# Patient Record
Sex: Male | Born: 1985 | Hispanic: No | Marital: Single | Smoking: Current every day smoker
Health system: Southern US, Community
[De-identification: ages and names within clinical notes are randomized; demographics above are authoritative.]

## PROBLEM LIST (undated history)

## (undated) HISTORY — PX: TONSILLECTOMY: SUR1361

## (undated) HISTORY — PX: HERNIA REPAIR: SHX51

---
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
## 2009-07-18 ENCOUNTER — Emergency Department (HOSPITAL_COMMUNITY): Admission: EM | Admit: 2009-07-18 | Discharge: 2009-07-18 | Payer: Self-pay | Admitting: Family Medicine
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
## 2017-09-23 ENCOUNTER — Other Ambulatory Visit: Payer: Self-pay

## 2017-09-23 ENCOUNTER — Encounter (HOSPITAL_BASED_OUTPATIENT_CLINIC_OR_DEPARTMENT_OTHER): Payer: Self-pay | Admitting: *Deleted

## 2017-09-23 ENCOUNTER — Emergency Department (HOSPITAL_BASED_OUTPATIENT_CLINIC_OR_DEPARTMENT_OTHER)
Admission: EM | Admit: 2017-09-23 | Discharge: 2017-09-23 | Disposition: A | Discharge status: Home / Self Care | Payer: Self-pay | Attending: Emergency Medicine | Admitting: Emergency Medicine

## 2017-09-23 DIAGNOSIS — F17210 Nicotine dependence, cigarettes, uncomplicated: Secondary | ICD-10-CM | POA: Insufficient documentation

## 2017-09-23 DIAGNOSIS — K0889 Other specified disorders of teeth and supporting structures: Secondary | ICD-10-CM | POA: Insufficient documentation

## 2017-09-23 MED ORDER — PENICILLIN V POTASSIUM 500 MG PO TABS: 500.0000 mg | ORAL_TABLET | Freq: Three times a day (TID) | ORAL | 0 refills | Status: DC

## 2017-09-23 MED ORDER — HYDROCODONE-ACETAMINOPHEN 5-325 MG PO TABS
1.0000 | ORAL_TABLET | Freq: Once | ORAL | Status: AC
  Administered 2017-09-23: 1 via ORAL
  Filled 2017-09-23: qty 1

## 2017-09-23 MED ORDER — IBUPROFEN 600 MG PO TABS: 600.0000 mg | ORAL_TABLET | Freq: Three times a day (TID) | ORAL | 0 refills | Status: DC | PRN

## 2017-09-23 MED ORDER — PENICILLIN V POTASSIUM 250 MG PO TABS
500.0000 mg | ORAL_TABLET | Freq: Once | ORAL | Status: AC
  Administered 2017-09-23: 500 mg via ORAL
  Filled 2017-09-23: qty 2

## 2017-09-23 MED ORDER — IBUPROFEN 400 MG PO TABS
600.0000 mg | ORAL_TABLET | Freq: Once | ORAL | Status: AC
  Administered 2017-09-23: 600 mg via ORAL
  Filled 2017-09-23: qty 1

## 2017-09-23 NOTE — ED Provider Notes (Signed)
[facility] EMERGENCY DEPARTMENT Provider Note   CSN: [id number] Arrival date & time: 09/23/17  0057     History   Chief Complaint Chief Complaint  Patient presents with  . Dental Pain    HPI Nicholas CockerDerek Pursley Jr. is a 32 y.o. male.  HPI 32 year old male presents the emergency department with intermittent left-sided jaw pain over the past year with worsening left jaw and left ear pain today.  No improvement with anti-inflammatories.  Patient reports no significant pain with chewing.  No fevers or chills.  No significant headache.  Pain is moderate in severity.  History of mandibular ORIF 2 years ago  History reviewed. No pertinent past medical history.  There are no active problems to display for this patient.   History reviewed. No pertinent surgical history.      Home Medications    Prior to Admission medications   Medication Sig Start Date End Date Taking? Authorizing Provider  ibuprofen (ADVIL,MOTRIN) 600 MG tablet Take 1 tablet (600 mg total) by mouth every 8 (eight) hours as needed. 09/23/17   Azalia Bilisampos, Elorah Dewing, MD  penicillin v potassium (VEETID) 500 MG tablet Take 1 tablet (500 mg total) by mouth 3 (three) times daily. 09/23/17   Azalia Bilisampos, Evett Kassa, MD    Family History History reviewed. No pertinent family history.  Social History Social History   Tobacco Use  . Smoking status: Current Every Day Smoker    Packs/day: 0.50    Types: Cigarettes  . Smokeless tobacco: Never Used  Substance Use Topics  . Alcohol use: Not Currently  . Drug use: Not Currently     Allergies   Patient has no known allergies.   Review of Systems Review of Systems  All other systems reviewed and are negative.    Physical Exam Updated Vital Signs BP 130/86 (BP Location: Left Arm)   Pulse 63   Temp 98.7 F (37.1 C) (Oral)   Resp 18   Ht 5\' 6"  (1.676 m)   Wt 72.6 kg (160 lb)   SpO2 97%   BMI 25.82 kg/m   Physical Exam  Constitutional: He is oriented to person,  place, and time. He appears well-developed and well-nourished.  HENT:  Head: Normocephalic.  Impacted left lower third molar with tenderness.  No gingival swelling or fluctuance.  Posterior pharynx is normal.  Uvula is midline.  No intraoral swelling.  Bilateral TMs are normal.  No mastoid tenderness  Eyes: EOM are normal.  Neck: Normal range of motion.  No neck swelling  Pulmonary/Chest: Effort normal.  Abdominal: He exhibits no distension.  Musculoskeletal: Normal range of motion.  Neurological: He is alert and oriented to person, place, and time.  Psychiatric: He has a normal mood and affect.  Nursing note and vitals reviewed.    ED Treatments / Results  Labs (all labs ordered are listed, but only abnormal results are displayed) Labs Reviewed - No data to display  EKG None  Radiology No results found.  Procedures Procedures (including critical care time)  Medications Ordered in ED Medications  ibuprofen (ADVIL,MOTRIN) tablet 600 mg (has no administration in time range)  HYDROcodone-acetaminophen (NORCO/VICODIN) 5-325 MG per tablet 1 tablet (has no administration in time range)  penicillin v potassium (VEETID) tablet 500 mg (has no administration in time range)     Initial Impression / Assessment and Plan / ED Course  I have reviewed the triage vital signs and the nursing notes.  Pertinent labs & imaging results that were available during my  care of the patient were reviewed by me and considered in my medical decision making (see chart for details).     Impacted molar.  Patient be placed on anti-inflammatories and antibiotics.  He will need oral surgery follow-up.  No indication for advanced imaging today.  He will need Panorex as an outpatient and further oral surgery/dental evaluation  Final Clinical Impressions(s) / ED Diagnoses   Final diagnoses:  Pain, dental    ED Discharge Orders        Ordered    penicillin v potassium (VEETID) 500 MG tablet  3 times  daily     09/23/17 0149    ibuprofen (ADVIL,MOTRIN) 600 MG tablet  Every 8 hours PRN     09/23/17 0149       Azalia Bilis, MD 09/23/17 [phone]

## 2017-09-23 NOTE — ED Triage Notes (Signed)
Left sided jaw pain with radiation up into ear intermittently x 1 year. This episode started tonight.

## 2017-10-03 ENCOUNTER — Other Ambulatory Visit: Payer: Self-pay

## 2017-10-03 ENCOUNTER — Emergency Department (HOSPITAL_BASED_OUTPATIENT_CLINIC_OR_DEPARTMENT_OTHER)
Admission: EM | Admit: 2017-10-03 | Discharge: 2017-10-03 | Disposition: A | Discharge status: Home / Self Care | Payer: Worker's Compensation | Attending: Emergency Medicine | Admitting: Emergency Medicine

## 2017-10-03 ENCOUNTER — Emergency Department (HOSPITAL_BASED_OUTPATIENT_CLINIC_OR_DEPARTMENT_OTHER): Payer: Worker's Compensation

## 2017-10-03 ENCOUNTER — Encounter (HOSPITAL_BASED_OUTPATIENT_CLINIC_OR_DEPARTMENT_OTHER): Payer: Self-pay | Admitting: *Deleted

## 2017-10-03 DIAGNOSIS — Y990 Civilian activity done for income or pay: Secondary | ICD-10-CM | POA: Diagnosis not present

## 2017-10-03 DIAGNOSIS — S61011A Laceration without foreign body of right thumb without damage to nail, initial encounter: Secondary | ICD-10-CM | POA: Diagnosis not present

## 2017-10-03 DIAGNOSIS — Y92511 Restaurant or cafe as the place of occurrence of the external cause: Secondary | ICD-10-CM | POA: Insufficient documentation

## 2017-10-03 DIAGNOSIS — S61012A Laceration without foreign body of left thumb without damage to nail, initial encounter: Secondary | ICD-10-CM

## 2017-10-03 DIAGNOSIS — Y9389 Activity, other specified: Secondary | ICD-10-CM | POA: Diagnosis not present

## 2017-10-03 DIAGNOSIS — W260XXA Contact with knife, initial encounter: Secondary | ICD-10-CM | POA: Insufficient documentation

## 2017-10-03 MED ORDER — LIDOCAINE HCL (PF) 1 % IJ SOLN
5.0000 mL | Freq: Once | INTRAMUSCULAR | Status: AC
  Administered 2017-10-03: 5 mL
  Filled 2017-10-03: qty 5

## 2017-10-03 MED ORDER — LIDOCAINE HCL (PF) 1 % IJ SOLN
INTRAMUSCULAR | Status: AC
  Filled 2017-10-03: qty 5

## 2017-10-03 MED ORDER — ACETAMINOPHEN 500 MG PO TABS: 500.0000 mg | ORAL_TABLET | Freq: Four times a day (QID) | ORAL | 0 refills | Status: AC | PRN

## 2017-10-03 MED ORDER — LIDOCAINE HCL (PF) 1 % IJ SOLN
5.0000 mL | Freq: Once | INTRAMUSCULAR | Status: AC
  Administered 2017-10-03: 5 mL

## 2017-10-03 MED ORDER — IBUPROFEN 800 MG PO TABS: 800.0000 mg | ORAL_TABLET | Freq: Three times a day (TID) | ORAL | 0 refills | Status: AC

## 2017-10-03 NOTE — Discharge Instructions (Signed)
Treatment: Keep your wound dry and dressing applied until this time tomorrow. After 24 hours, you may wash with warm soapy water. Dry and apply antibiotic ointment and clean dressing. Do this daily until your sutures are removed.  Follow-up: Please return to emergency department in 10 days for suture removal. Be aware of signs of infection: fever, increasing pain, redness, swelling, drainage from the area. Please call your primary care provider or return to emergency department if you develop any of these symptoms or if any of the sutures come out prior to removal. Please return to the emergency department if you develop any other new or worsening symptoms.

## 2017-10-03 NOTE — ED Provider Notes (Signed)
[facility] EMERGENCY DEPARTMENT Provider Note   CSN: [id number] Arrival date & time: 10/03/17  1659     History   Chief Complaint Chief Complaint  Patient presents with  . Laceration    HPI Nicholas CockerDerek Rumsey Jr. is a 32 y.o. male who presents with laceration to the right thumb after he was cutting a sandwich at his job at [employer].  He feels like he hit the bone.  He does have full range of motion and sensation intact.  His tetanus is up-to-date.  He denies any other injuries.  HPI  History reviewed. No pertinent past medical history.  There are no active problems to display for this patient.   Past Surgical History:  Procedure Laterality Date  . HERNIA REPAIR    . TONSILLECTOMY          Home Medications    Prior to Admission medications   Medication Sig Start Date End Date Taking? Authorizing Provider  acetaminophen (TYLENOL) 500 MG tablet Take 1 tablet (500 mg total) by mouth every 6 (six) hours as needed. 10/03/17   [name], Waylan BogaAlexandra M, PA-C  ibuprofen (ADVIL,MOTRIN) 800 MG tablet Take 1 tablet (800 mg total) by mouth 3 (three) times daily. 10/03/17   Emi HolesLaw, Kaizley Aja M, PA-C    Family History History reviewed. No pertinent family history.  Social History Social History   Tobacco Use  . Smoking status: Current Every Day Smoker    Packs/day: 0.50    Types: Cigarettes  . Smokeless tobacco: Never Used  Substance Use Topics  . Alcohol use: Not Currently  . Drug use: Not Currently     Allergies   Patient has no known allergies.   Review of Systems Review of Systems  Constitutional: Negative for fever.  Skin: Positive for wound.  Neurological: Negative for numbness.     Physical Exam Updated Vital Signs BP 138/74   Pulse 68   Temp 98.3 F (36.8 C) (Oral)   Resp 18   Ht 5\' 6"  (1.676 m)   Wt 72.6 kg (160 lb)   SpO2 98%   BMI 25.82 kg/m   Physical Exam  Constitutional: He appears well-developed and well-nourished. No distress.  HENT:    Head: Normocephalic and atraumatic.  Mouth/Throat: Oropharynx is clear and moist. No oropharyngeal exudate.  Eyes: Pupils are equal, round, and reactive to light. Conjunctivae are normal. Right eye exhibits no discharge. Left eye exhibits no discharge. No scleral icterus.  Neck: Normal range of motion.  Cardiovascular: Normal rate, regular rhythm, normal heart sounds and intact distal pulses. Exam reveals no gallop and no friction rub.  No murmur heard. Pulmonary/Chest: Effort normal and breath sounds normal. No stridor. No respiratory distress. He has no wheezes. He has no rales.  Musculoskeletal: He exhibits no edema.  2 cm laceration over the ulnar aspect of the IP joint on the L thumb; bleeding controlled, no tendon involvement visualized, FROM of the digit, sensation intact, cap refill <2secs  Neurological: He is alert. Coordination normal.  Skin: Skin is warm and dry. No rash noted. He is not diaphoretic. No pallor.  Psychiatric: He has a normal mood and affect.  Nursing note and vitals reviewed.    ED Treatments / Results  Labs (all labs ordered are listed, but only abnormal results are displayed) Labs Reviewed - No data to display  EKG None  Radiology Dg Finger Thumb Left  Result Date: 10/03/2017 CLINICAL DATA:  Cut finger with knife EXAM: LEFT THUMB 2+V COMPARISON:  None. FINDINGS: There is no evidence of fracture or dislocation. There is no evidence of arthropathy or other focal bone abnormality. No radiopaque foreign body in the soft tissues. IMPRESSION: No acute osseous abnormality. Electronically Signed   By: Jasmine Pang M.D.   On: 10/03/2017 18:02    Procedures .Marland KitchenLaceration Repair Date/Time: 10/03/2017 6:43 PM Performed by: Emi Holes, PA-C Authorized by: Emi Holes, PA-C   Consent:    Consent obtained:  Verbal   Consent given by:  Patient   Risks discussed:  Infection and pain   Alternatives discussed:  No treatment Anesthesia (see MAR for exact  dosages):    Anesthesia method:  Local infiltration   Local anesthetic:  Lidocaine 1% w/o epi Laceration details:    Location:  Finger   Finger location:  L thumb   Length (cm):  2   Depth (mm):  2 Repair type:    Repair type:  Simple Pre-procedure details:    Preparation:  Patient was prepped and draped in usual sterile fashion and imaging obtained to evaluate for foreign bodies Exploration:    Hemostasis achieved with:  Direct pressure and tourniquet   Wound exploration: wound explored through full range of motion and entire depth of wound probed and visualized     Wound extent: no foreign bodies/material noted, no nerve damage noted, no tendon damage noted and no underlying fracture noted     Contaminated: no   Treatment:    Area cleansed with:  Saline   Amount of cleaning:  Standard   Irrigation solution:  Sterile saline and tap water   Irrigation volume:  200   Irrigation method:  Syringe and pressure wash   Visualized foreign bodies/material removed: no   Skin repair:    Repair method:  Sutures   Suture size:  5-0   Wound skin closure material used: Ethilon.   Suture technique:  Simple interrupted   Number of sutures:  4 Approximation:    Approximation:  Close Post-procedure details:    Dressing:  Antibiotic ointment, sterile dressing and splint for protection   Patient tolerance of procedure:  Tolerated well, no immediate complications   (including critical care time)  Medications Ordered in ED Medications  lidocaine (PF) (XYLOCAINE) 1 % injection 5 mL (5 mLs Infiltration Given by Other 10/03/17 1715)  lidocaine (PF) (XYLOCAINE) 1 % injection 5 mL (5 mLs Infiltration Given 10/03/17 1819)     Initial Impression / Assessment and Plan / ED Course  I have reviewed the triage vital signs and the nursing notes.  Pertinent labs & imaging results that were available during my care of the patient were reviewed by me and considered in my medical decision making (see chart for  details).     Patient with left thumb laceration after cutting  it with a knife.  X-ray is negative for foreign body or osseous abnormality.  Tetanus UTD. Laceration occurred < 12 hours prior to repair. Discussed laceration care with pt and answered questions.  Splint given for protection to wear for 4-5 days.  Pt to f-u for suture removal in 10 days and wound check sooner should there be signs of dehiscence or infection. Pt is hemodynamically stable with no complaints prior to dc.  Patient understands and agrees with plan.  Patient vitals stable throughout ED course.   Final Clinical Impressions(s) / ED Diagnoses   Final diagnoses:  Laceration of left thumb without foreign body without damage to nail, initial encounter    ED  Discharge Orders        Ordered    ibuprofen (ADVIL,MOTRIN) 800 MG tablet  3 times daily     10/03/17 1842    acetaminophen (TYLENOL) 500 MG tablet  Every 6 hours PRN     10/03/17 1842       Emi Holes, PA-C 10/03/17 1846    Arby Barrette, MD 10/07/17 1231

## 2017-10-03 NOTE — ED Triage Notes (Signed)
Pt c/o lac to left thumb by metal x 1 hr ago

## 2017-10-03 NOTE — ED Notes (Signed)
Patient transported to X-ray 

## 2017-10-03 NOTE — ED Notes (Signed)
ED Provider at bedside. 

## 2017-10-03 NOTE — ED Notes (Signed)
Pt returned from xray

## 2017-10-03 NOTE — ED Notes (Signed)
Pt verbalizes understanding of d/c instructions and denies any further needs at this time. 

## 2017-10-03 NOTE — ED Notes (Signed)
EDP at bedside  

## 2017-10-15 ENCOUNTER — Encounter (HOSPITAL_BASED_OUTPATIENT_CLINIC_OR_DEPARTMENT_OTHER): Payer: Self-pay | Admitting: *Deleted

## 2017-10-15 ENCOUNTER — Other Ambulatory Visit: Payer: Self-pay

## 2017-10-15 ENCOUNTER — Emergency Department (HOSPITAL_BASED_OUTPATIENT_CLINIC_OR_DEPARTMENT_OTHER)
Admission: EM | Admit: 2017-10-15 | Discharge: 2017-10-15 | Disposition: A | Discharge status: Home / Self Care | Payer: Self-pay | Attending: Emergency Medicine | Admitting: Emergency Medicine

## 2017-10-15 DIAGNOSIS — F17210 Nicotine dependence, cigarettes, uncomplicated: Secondary | ICD-10-CM | POA: Insufficient documentation

## 2017-10-15 DIAGNOSIS — Z4802 Encounter for removal of sutures: Secondary | ICD-10-CM

## 2017-10-15 DIAGNOSIS — X58XXXA Exposure to other specified factors, initial encounter: Secondary | ICD-10-CM | POA: Insufficient documentation

## 2017-10-15 DIAGNOSIS — S61012D Laceration without foreign body of left thumb without damage to nail, subsequent encounter: Secondary | ICD-10-CM | POA: Insufficient documentation

## 2017-10-15 NOTE — ED Triage Notes (Signed)
Pt here for suture removal

## 2017-10-15 NOTE — ED Provider Notes (Signed)
[facility] EMERGENCY DEPARTMENT Provider Note   CSN: [id number] Arrival date & time: 10/15/17  1336     History   Chief Complaint Chief Complaint  Patient presents with  . Suture / Staple Removal    HPI Nicholas CockerDerek Kotch Jr. is a 32 y.o. male.  HPI Requesting suture removal from left thumb from 12 days ago.  No other complaints.  No fevers.  No drainage.  No redness.   History reviewed. No pertinent past medical history.  There are no active problems to display for this patient.   Past Surgical History:  Procedure Laterality Date  . HERNIA REPAIR    . TONSILLECTOMY          Home Medications    Prior to Admission medications   Medication Sig Start Date End Date Taking? Authorizing Provider  acetaminophen (TYLENOL) 500 MG tablet Take 1 tablet (500 mg total) by mouth every 6 (six) hours as needed. 10/03/17   Law, Waylan BogaAlexandra M, PA-C  ibuprofen (ADVIL,MOTRIN) 800 MG tablet Take 1 tablet (800 mg total) by mouth 3 (three) times daily. 10/03/17   Emi HolesLaw, Alexandra M, PA-C    Family History History reviewed. No pertinent family history.  Social History Social History   Tobacco Use  . Smoking status: Current Every Day Smoker    Packs/day: 0.50    Types: Cigarettes  . Smokeless tobacco: Never Used  Substance Use Topics  . Alcohol use: Not Currently  . Drug use: Not Currently     Allergies   Patient has no known allergies.   Review of Systems Review of Systems  All other systems reviewed and are negative.    Physical Exam Updated Vital Signs Ht 5\' 6"  (1.676 m)   Wt 72.6 kg (160 lb)   BMI 25.82 kg/m   Physical Exam  Constitutional: He is oriented to person, place, and time. He appears well-developed and well-nourished.  HENT:  Head: Normocephalic.  Eyes: EOM are normal.  Neck: Normal range of motion.  Pulmonary/Chest: Effort normal.  Abdominal: He exhibits no distension.  Musculoskeletal: Normal range of motion.  Left thumb with sutures in  place.  No wound dehiscence.  No signs of infection.  Neurological: He is alert and oriented to person, place, and time.  Psychiatric: He has a normal mood and affect.  Nursing note and vitals reviewed.    ED Treatments / Results  Labs (all labs ordered are listed, but only abnormal results are displayed) Labs Reviewed - No data to display  EKG None  Radiology No results found.  Procedures Procedures (including critical care time)  SUTURE REMOVAL Performed by: Azalia BilisKevin Danamarie Minami Consent: Verbal consent obtained. Patient identity confirmed: provided demographic data Time out: Immediately prior to procedure a "time out" was called to verify the correct patient, procedure, equipment, support staff and site/side marked as required. Location: left thum Wound Appearance: clean Sutures/Staples Removed: 4 Patient tolerance: Patient tolerated the procedure well with no immediate complications.     Medications Ordered in ED Medications - No data to display   Initial Impression / Assessment and Plan / ED Course  I have reviewed the triage vital signs and the nursing notes.  Pertinent labs & imaging results that were available during my care of the patient were reviewed by me and considered in my medical decision making (see chart for details).       Final Clinical Impressions(s) / ED Diagnoses   Final diagnoses:  Visit for suture removal    ED Discharge  Orders    None       Azalia Bilis, MD 10/15/17 1344

## 2017-10-15 NOTE — ED Notes (Signed)
ED Provider in triage to remove stitches

## 2018-11-07 IMAGING — CR DG FINGER THUMB 2+V*L*
3 series · 3 of 3 positions shown · non-contrast
Comparison: None.

CLINICAL DATA: Cut finger with knife

EXAM:
LEFT THUMB 2+V

[x finger pa left]
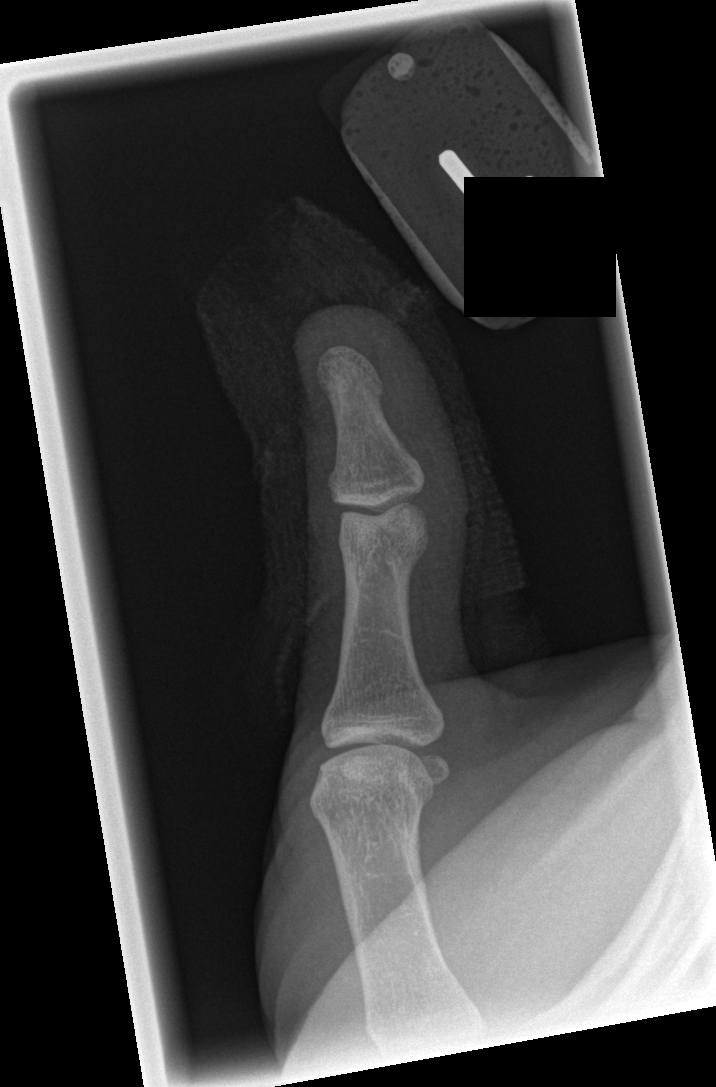

[x finger obl. left]
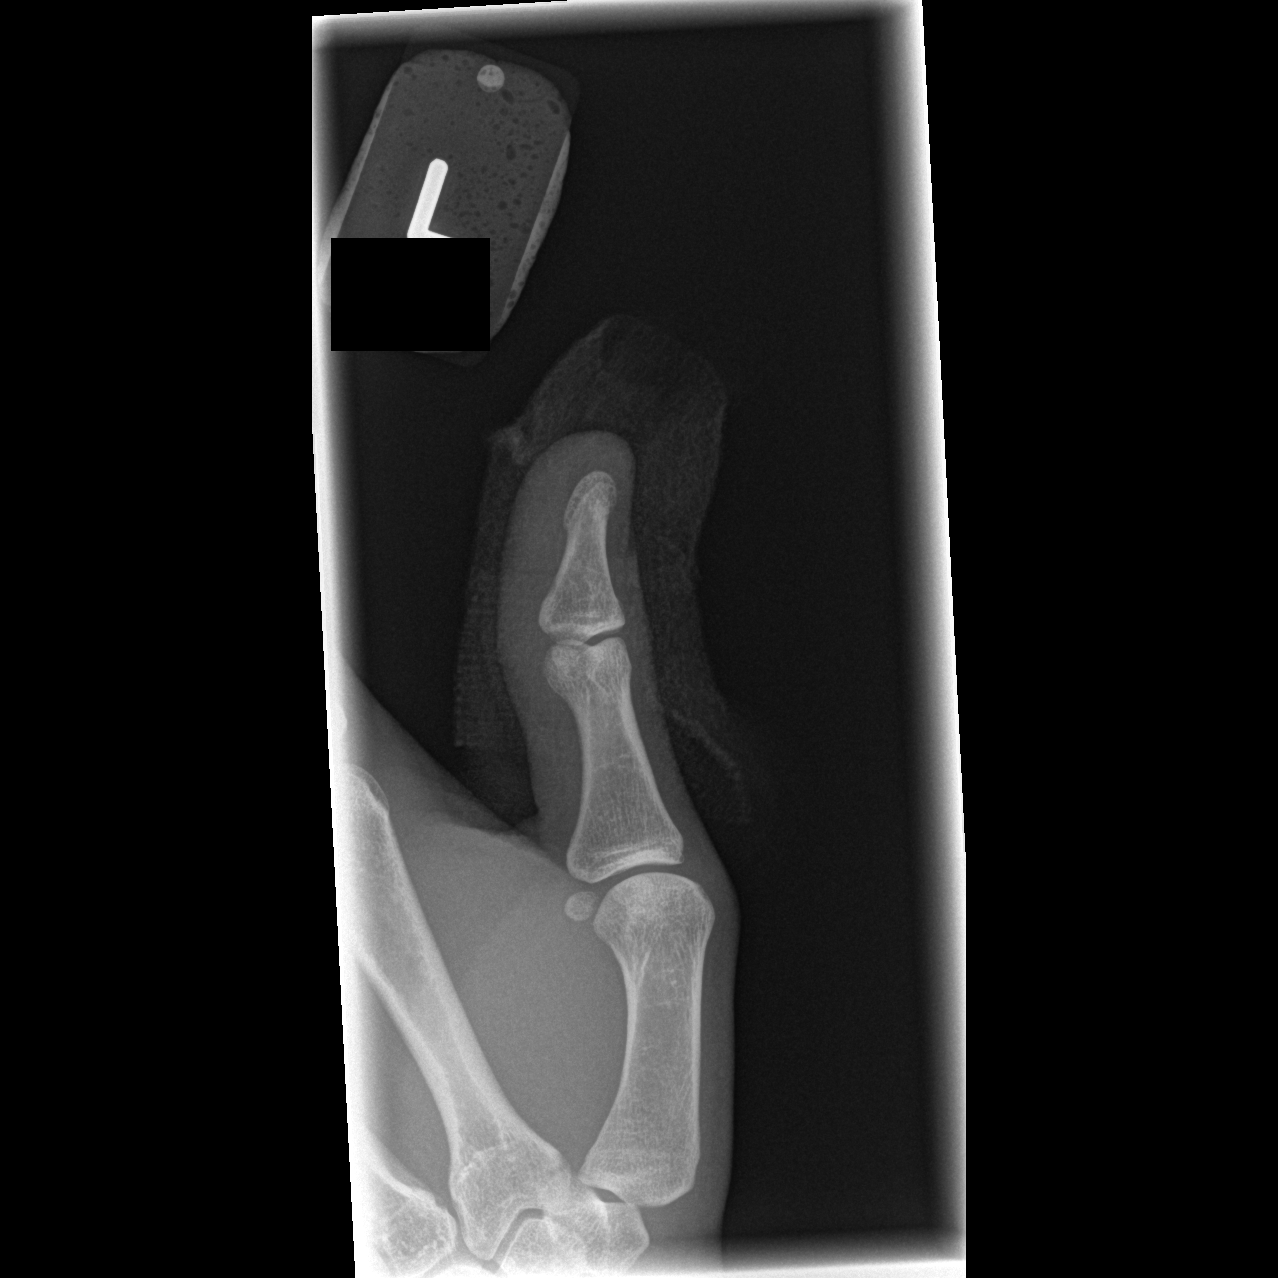

[x finger lateral left]
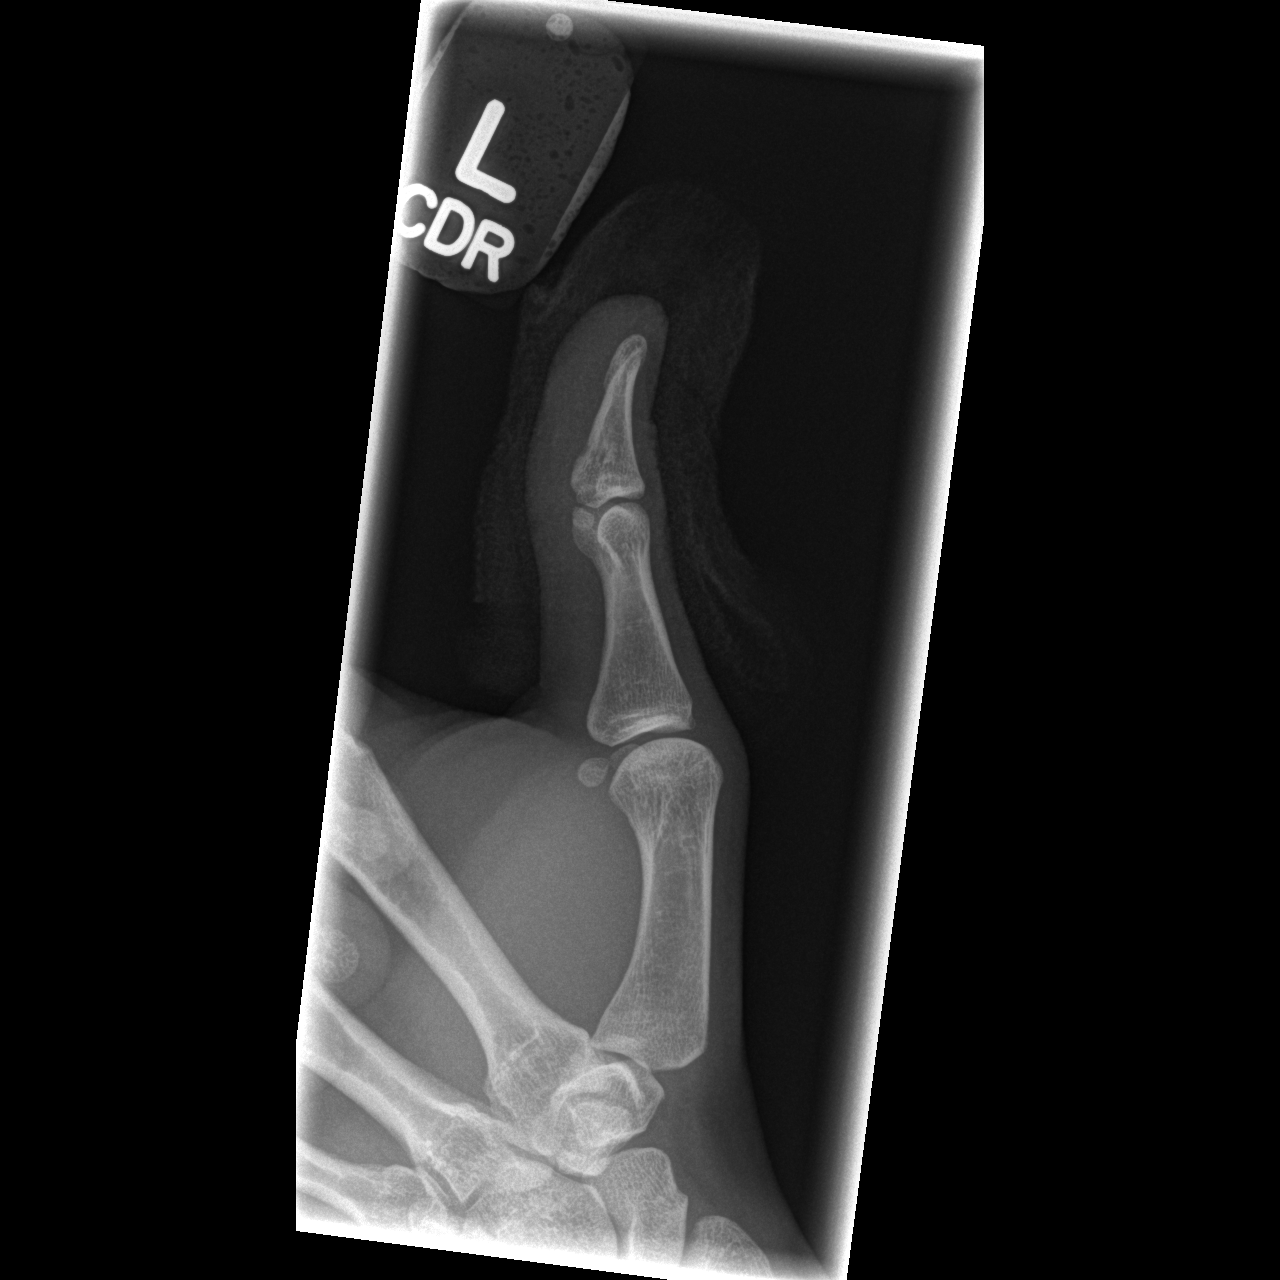

[3 of 3 positions shown; findings below may reference images not displayed]

FINDINGS: There is no evidence of fracture or dislocation. There is no
evidence of arthropathy or other focal bone abnormality. No
radiopaque foreign body in the soft tissues.
IMPRESSION: No acute osseous abnormality.
